# Patient Record
Sex: Female | Born: 1970 | Race: Black or African American | Hispanic: No | Marital: Married | Smoking: Current every day smoker
Health system: Southern US, Community
[De-identification: ages and names within clinical notes are randomized; demographics above are authoritative.]

## PROBLEM LIST (undated history)

## (undated) HISTORY — PX: TUBAL LIGATION: SHX77

---

## 2002-03-05 ENCOUNTER — Other Ambulatory Visit: Admission: RE | Admit: 2002-03-05 | Discharge: 2002-03-05 | Payer: Self-pay | Admitting: Obstetrics and Gynecology

## 2003-03-31 ENCOUNTER — Other Ambulatory Visit: Admission: RE | Admit: 2003-03-31 | Discharge: 2003-03-31 | Payer: Self-pay | Admitting: Obstetrics and Gynecology

## 2003-04-01 ENCOUNTER — Other Ambulatory Visit: Admission: RE | Admit: 2003-04-01 | Discharge: 2003-04-01 | Payer: Self-pay | Admitting: Internal Medicine

## 2004-06-15 ENCOUNTER — Other Ambulatory Visit: Admission: RE | Admit: 2004-06-15 | Discharge: 2004-06-15 | Payer: Self-pay | Admitting: Obstetrics and Gynecology

## 2005-08-18 ENCOUNTER — Other Ambulatory Visit: Admission: RE | Admit: 2005-08-18 | Discharge: 2005-08-18 | Payer: Self-pay | Admitting: Obstetrics and Gynecology

## 2006-01-10 ENCOUNTER — Ambulatory Visit (HOSPITAL_COMMUNITY): Admission: RE | Admit: 2006-01-10 | Discharge: 2006-01-10 | Payer: Self-pay | Admitting: Obstetrics and Gynecology

## 2006-03-03 ENCOUNTER — Inpatient Hospital Stay (HOSPITAL_COMMUNITY): Admission: RE | Admit: 2006-03-03 | Discharge: 2006-03-05 | Payer: Self-pay | Admitting: Obstetrics and Gynecology

## 2006-03-06 ENCOUNTER — Encounter: Admission: RE | Admit: 2006-03-06 | Discharge: 2006-04-05 | Payer: Self-pay | Admitting: Obstetrics and Gynecology

## 2006-04-06 ENCOUNTER — Encounter: Admission: RE | Admit: 2006-04-06 | Discharge: 2006-04-14 | Payer: Self-pay | Admitting: Obstetrics and Gynecology

## 2011-11-21 ENCOUNTER — Other Ambulatory Visit: Payer: Self-pay | Admitting: Family Medicine

## 2011-11-21 ENCOUNTER — Other Ambulatory Visit: Payer: Self-pay

## 2011-11-21 DIAGNOSIS — R319 Hematuria, unspecified: Secondary | ICD-10-CM

## 2011-11-22 ENCOUNTER — Other Ambulatory Visit: Payer: Self-pay

## 2012-06-14 ENCOUNTER — Other Ambulatory Visit: Payer: Self-pay | Admitting: Family Medicine

## 2012-06-14 DIAGNOSIS — Z1231 Encounter for screening mammogram for malignant neoplasm of breast: Secondary | ICD-10-CM

## 2012-07-30 ENCOUNTER — Ambulatory Visit
Admission: RE | Admit: 2012-07-30 | Discharge: 2012-07-30 | Disposition: A | Discharge status: Home / Self Care | Payer: Managed Care, Other (non HMO) | Source: Ambulatory Visit | Attending: Family Medicine | Admitting: Family Medicine

## 2012-07-30 DIAGNOSIS — Z1231 Encounter for screening mammogram for malignant neoplasm of breast: Secondary | ICD-10-CM

## 2012-07-31 ENCOUNTER — Other Ambulatory Visit: Payer: Self-pay | Admitting: Family Medicine

## 2012-07-31 DIAGNOSIS — R928 Other abnormal and inconclusive findings on diagnostic imaging of breast: Secondary | ICD-10-CM

## 2012-08-08 ENCOUNTER — Ambulatory Visit
Admission: RE | Admit: 2012-08-08 | Discharge: 2012-08-08 | Disposition: A | Discharge status: Home / Self Care | Payer: Managed Care, Other (non HMO) | Source: Ambulatory Visit | Attending: Family Medicine | Admitting: Family Medicine

## 2012-08-08 DIAGNOSIS — R928 Other abnormal and inconclusive findings on diagnostic imaging of breast: Secondary | ICD-10-CM

## 2014-07-28 ENCOUNTER — Other Ambulatory Visit: Payer: Self-pay | Admitting: Family Medicine

## 2014-07-28 DIAGNOSIS — R921 Mammographic calcification found on diagnostic imaging of breast: Secondary | ICD-10-CM

## 2015-01-26 ENCOUNTER — Encounter (HOSPITAL_COMMUNITY): Payer: Self-pay | Admitting: Family Medicine

## 2015-01-26 ENCOUNTER — Emergency Department (HOSPITAL_COMMUNITY): Payer: Managed Care, Other (non HMO)

## 2015-01-26 ENCOUNTER — Emergency Department (HOSPITAL_COMMUNITY)
Admission: EM | Admit: 2015-01-26 | Discharge: 2015-01-26 | Disposition: A | Discharge status: Home / Self Care | Payer: Managed Care, Other (non HMO) | Attending: Emergency Medicine | Admitting: Emergency Medicine

## 2015-01-26 DIAGNOSIS — R000 Tachycardia, unspecified: Secondary | ICD-10-CM | POA: Insufficient documentation

## 2015-01-26 DIAGNOSIS — Z79899 Other long term (current) drug therapy: Secondary | ICD-10-CM | POA: Diagnosis not present

## 2015-01-26 DIAGNOSIS — J029 Acute pharyngitis, unspecified: Secondary | ICD-10-CM | POA: Diagnosis not present

## 2015-01-26 DIAGNOSIS — R9431 Abnormal electrocardiogram [ECG] [EKG]: Secondary | ICD-10-CM | POA: Diagnosis present

## 2015-01-26 DIAGNOSIS — F17200 Nicotine dependence, unspecified, uncomplicated: Secondary | ICD-10-CM | POA: Insufficient documentation

## 2015-01-26 LAB — CBC
HEMATOCRIT: 44.1 % (ref 36.0–46.0)
HEMOGLOBIN: 15.1 g/dL — AB (ref 12.0–15.0)
MCH: 35 pg — AB (ref 26.0–34.0)
MCHC: 34.2 g/dL (ref 30.0–36.0)
MCV: 102.1 fL — ABNORMAL HIGH (ref 78.0–100.0)
Platelets: 316 10*3/uL (ref 150–400)
RBC: 4.32 MIL/uL (ref 3.87–5.11)
RDW: 12.6 % (ref 11.5–15.5)
WBC: 11.4 10*3/uL — ABNORMAL HIGH (ref 4.0–10.5)

## 2015-01-26 LAB — BASIC METABOLIC PANEL
ANION GAP: 14 (ref 5–15)
BUN: <5 mg/dL — ABNORMAL LOW (ref 6–20)
CO2: 23 mmol/L (ref 22–32)
Calcium: 10.1 mg/dL (ref 8.9–10.3)
Chloride: 99 mmol/L — ABNORMAL LOW (ref 101–111)
Creatinine, Ser: 0.83 mg/dL (ref 0.44–1.00)
GFR calc non Af Amer: >60 mL/min (ref >60)
GLUCOSE: 112 mg/dL — AB (ref 65–99)
POTASSIUM: 4.1 mmol/L (ref 3.5–5.1)
Sodium: 136 mmol/L (ref 135–145)

## 2015-01-26 LAB — T4, FREE: Free T4: 0.87 ng/dL (ref 0.61–1.12)

## 2015-01-26 LAB — I-STAT TROPONIN, ED: Troponin i, poc: 0 ng/mL (ref 0.00–0.08)

## 2015-01-26 LAB — TSH: TSH: 1.325 u[IU]/mL (ref 0.350–4.500)

## 2015-01-26 MED ORDER — SODIUM CHLORIDE 0.9 % IV BOLUS (SEPSIS)
1000.0000 mL | Freq: Once | INTRAVENOUS | Status: AC
  Administered 2015-01-26: 1000 mL via INTRAVENOUS

## 2015-01-26 NOTE — Discharge Instructions (Signed)
As discussed, your evaluation today has been largely reassuring.  But, it is important that you monitor your condition carefully, and do not hesitate to return to the ED if you develop new, or concerning changes in your condition. ? ?Otherwise, please follow-up with your physician for appropriate ongoing care. ? ?

## 2015-01-26 NOTE — ED Provider Notes (Signed)
CSN: [id number]   [id number]  Arrival date & time 01/26/15  1547 History   First MD Initiated Contact with Patient 01/26/15 1802     Chief Complaint  Patient presents with  . sent here for abnormal EKG      (Consider location/radiation/quality/duration/timing/severity/associated sxs/prior Treatment) HPI Reason presents from her physician's office due to staff concerns of tachycardia, hypertension. The patient went to the office earlier today due to sore throat. She notes that she has a sore throat for several days. She denies other ongoing illness, including no fever, chills, nausea, vomiting, lightheadedness, chest pain, dyspnea. With the persistency of the sore throat, she went for evaluation. Per her report, she was told that she had notably elevated blood pressure, heart rate, and was sent here for evaluation. She denies recent diet changes, medication changes, activity changes. She denies history of hypertension, thyroid disease, other substantial medical problems.  History reviewed. No pertinent past medical history. History reviewed. No pertinent past surgical history. History reviewed. No pertinent family history. Social History  Substance Use Topics  . Smoking status: Current Every Day Smoker  . Smokeless tobacco: None  . Alcohol Use: Yes   OB History    No data available     Review of Systems  Constitutional:       Per HPI, otherwise negative  HENT:       Per HPI, otherwise negative  Respiratory:       Per HPI, otherwise negative  Cardiovascular:       Per HPI, otherwise negative  Gastrointestinal: Negative for vomiting.  Endocrine:       Negative aside from HPI  Genitourinary:       Neg aside from HPI   Musculoskeletal:       Per HPI, otherwise negative  Skin: Negative.   Neurological: Negative for syncope.      Allergies  Review of patient's allergies indicates no known allergies.  Home Medications   Prior to Admission medications   Medication Sig Start  Date End Date Taking? Authorizing Provider  Multiple Vitamin (MULTIVITAMIN WITH MINERALS) TABS tablet Take 1 tablet by mouth daily.   Yes Historical Provider, MD  OVER THE COUNTER MEDICATION Take 1 application by mouth daily as needed (for weight gain).    Yes Historical Provider, MD   BP 148/111 mmHg  Pulse 129  Temp(Src) 98 F (36.7 C) (Oral)  Resp 16  Ht 5\' 2"  (1.575 m)  Wt 90 lb (40.824 kg)  BMI 16.46 kg/m2  SpO2 100% Physical Exam  Constitutional: She is oriented to person, place, and time. She appears well-developed and well-nourished. No distress.  HENT:  Head: Normocephalic and atraumatic.  Mouth/Throat: Uvula is midline, oropharynx is clear and moist and mucous membranes are normal.  Eyes: Conjunctivae and EOM are normal.  Neck: Neck supple. No tracheal tenderness and no muscular tenderness present. No rigidity. No edema, no erythema and normal range of motion present.  Cardiovascular: Regular rhythm.  Tachycardia present.   Pulmonary/Chest: Effort normal and breath sounds normal. No stridor. No respiratory distress.  Abdominal: She exhibits no distension.  Musculoskeletal: She exhibits no edema.  Lymphadenopathy:    She has no cervical adenopathy.  Neurological: She is alert and oriented to person, place, and time. No cranial nerve deficit.  Skin: Skin is warm and dry.  Psychiatric: She has a normal mood and affect.  Nursing note and vitals reviewed.   ED Course  Procedures (including critical care time) Labs Review Labs Reviewed  BASIC  METABOLIC PANEL - Abnormal; Notable for the following:    Chloride 99 (*)    Glucose, Bld 112 (*)    BUN <5 (*)    All other components within normal limits  CBC - Abnormal; Notable for the following:    WBC 11.4 (*)    Hemoglobin 15.1 (*)    MCV 102.1 (*)    MCH 35.0 (*)    All other components within normal limits  TSH  T4, FREE  I-STAT TROPOININ, ED    Imaging Review Dg Chest 2 View  01/26/2015  CLINICAL DATA:   Patient referred to ER From urgent care for fast heart rate and high blood pressure heart rate was 128 bp 140/102 3 hours ago. bp recently checked and has not decreased.smokes 1 pack a day, EXAM: CHEST  2 VIEW COMPARISON:  None. FINDINGS: Mild convex left thoracolumbar spine curvature. Midline trachea. Normal heart size and mediastinal contours. No pleural effusion or pneumothorax. Clear lungs. No congestive failure. IMPRESSION: No acute cardiopulmonary disease. Electronically Signed   By: Jeronimo Greaves M.D.   On: 01/26/2015 17:25   I have personally reviewed and evaluated these images and lab results as part of my medical decision-making.   EKG Interpretation   Date/Time:  Monday January 26 2015 16:07:32 EST Ventricular Rate:  132 PR Interval:  136 QRS Duration: 74 QT Interval:  378 QTC Calculation: 560 R Axis:   177 Text Interpretation: Long QTc Sinus tachycardia  Left posterior fascicular block T wave abnormality, consider inferior  ischemia Abnormal ECG Sinus tachycardia Artifact T wave abnormality QT  prolonged Abnormal ekg Confirmed by Gerhard Munch  MD [phone]) on  01/26/2015 7:01:10 PM     Cardiac 111 sinus tach abnormal  I reviewed the EKG from the patient's physicians office, similar to our EKG performed here.   ecg #2 following ivf   EKG Interpretation  Date/Time:  Monday January 26 2015 19:59:01 EST Ventricular Rate:  112 PR Interval:  170 QRS Duration: 91 QT Interval:  326 QTC Calculation: 445 R Axis:   56 Text Interpretation:  Sinus tachycardia ST elevation, consider inferior injury Sinus tachycardia Artifact QT is shorter than prior Abnormal ekg Confirmed by Gerhard Munch  MD [phone]) on 01/26/2015 8:03:07 PM      On repeat exam the patient remains in no distress, no new complaints. Heart rate has diminished, and she continues to have lites other than sore throat.  MDM  She presents from her physician's office with concern for tachycardia,  hypertension. Here the patient is awake, alert, in no distress. Patient does have mild persistent tachycardia, though improves with IV fluids. Patient's blood pressure is notable for elevated diastolic value, but patient has no asymmetry of pulses, no widening of the mediastinum, no evidence for neurovascular compromise. Some suspicion for ongoing viral pharyngitis. With other considerations for persistent tachycardia, mild hypertension, the patient had endocrine labs sent. With an otherwise a symptomatically patient, she was discharged in stable condition to follow-up as an outpatient, tomorrow, with primary care and cardiology.  Gerhard Munch, MD 01/26/15 2005

## 2015-01-26 NOTE — ED Notes (Signed)
[facility]. sts she feels fine. Pt HR is 130, BP elevated. Denise any chest pain and SOB.

## 2015-01-29 ENCOUNTER — Encounter: Payer: Self-pay | Admitting: Cardiology

## 2015-01-29 ENCOUNTER — Ambulatory Visit (INDEPENDENT_AMBULATORY_CARE_PROVIDER_SITE_OTHER): Payer: Managed Care, Other (non HMO) | Admitting: Cardiology

## 2015-01-29 VITALS — BP 120/80 | HR 112 | Ht 62.0 in | Wt 94.0 lb

## 2015-01-29 DIAGNOSIS — R9431 Abnormal electrocardiogram [ECG] [EKG]: Secondary | ICD-10-CM | POA: Diagnosis not present

## 2015-01-29 DIAGNOSIS — R000 Tachycardia, unspecified: Secondary | ICD-10-CM

## 2015-01-29 DIAGNOSIS — I10 Essential (primary) hypertension: Secondary | ICD-10-CM | POA: Diagnosis not present

## 2015-01-29 NOTE — Progress Notes (Signed)
Cardiology Office Note  NEW PATIENT NOTE   Date:  01/29/2015   ID:  Victoria Hester, DOB 10/08/1970, MRN [id number]  PCP:  Lilia ArgueKAPLAN,KRISTEN, PA-C  Cardiologist:  New Dr. Clifton JamesMcAlhany     Chief Complaint  Patient presents with  . Hypertension    no pain      History of Present Illness: Victoria Guildamla Jolly is a 44 y.o. female who presents for post hospitalization-ER visit for tachycardia and hypertension. BP in the ER was 148/111 and pulse 129 she also dx with oral pharyngitis and her HR improved with IV fluids.   Labs were normal with neg troponin and CXR with normal heart size, no acute cardiopulmonary disease.   Today no further throat pain, no chest pain and no SOB.  She does smoke about half a ppd.  She has lost 5-6 lbs over last several months.  She does complain of fatigue.  Her HR today is 112.      History reviewed. No pertinent past medical history.  Denies CVA,, HTN, DM, heart disease   Past Surgical History  Procedure Laterality Date  . Tubal ligation       Current Outpatient Prescriptions  Medication Sig Dispense Refill  . Multiple Vitamin (MULTIVITAMIN WITH MINERALS) TABS tablet Take 1 tablet by mouth daily.     No current facility-administered medications for this visit.    Allergies:   Review of patient's allergies indicates no known allergies.    Social History:  The patient  reports that she has been smoking.  She has never used smokeless tobacco. She reports that she drinks about 1.2 oz of alcohol per week. She reports that she does not use illicit drugs.   Family History:  The patient's family history includes Diabetes type II in her mother; Healthy in her brother; Hypertension in her mother. There is no history of Heart attack or Stroke.    ROS:  General:no colds or fevers, + 5 lb weight loss over several months she does eat but decreased appetite. + fatigue Skin:no rashes or ulcers HEENT:no blurred vision, no congestion CV:see HPI PUL:see HPI GI:no diarrhea  constipation or melena, no indigestion GU:no hematuria, no dysuria MS:no joint pain, no claudication Neuro:no syncope, no lightheadedness Endo:no diabetes, no thyroid disease recent thyroid was stable.   Wt Readings from Last 3 Encounters:  01/29/15 94 lb (42.638 kg)  01/26/15 90 lb (40.824 kg)     PHYSICAL EXAM: VS:  BP 120/80 mmHg  Pulse 112  Ht 5\' 2"  (1.575 m)  Wt 94 lb (42.638 kg)  BMI 17.19 kg/m2  LMP 01/15/2015 , BMI Body mass index is 17.19 kg/(m^2). General:Pleasant affect, NAD Skin:Warm and dry, brisk capillary refill HEENT:normocephalic, sclera clear, mucus membranes moist Neck:supple, no JVD, no bruits  Heart:S1S2 RRR without murmur, + S3 gallup, no rub or click Lungs:clear without rales, rhonchi, or wheezes NWG:NFAOAbd:soft, non tender, + BS, do not palpate liver spleen or masses Ext:no lower ext edema, 2+ pedal pulses, 2+ radial pulses Neuro:alert and oriented X 3, MAE, follows commands, + facial symmetry    EKG:  EKG is ordered today. The ekg ordered today demonstrates STach 112 t wave inversion in V 3 new small Q wave in II,III, AVF,  Overall improved from ER visit.     Recent Labs: 01/26/2015: BUN <5*; Creatinine, Ser 0.83; Hemoglobin 15.1*; Platelets 316; Potassium 4.1; Sodium 136; TSH 1.325    Lipid Panel No results found for: CHOL, TRIG, HDL, CHOLHDL, VLDL, LDLCALC, LDLDIRECT  Other studies Reviewed: Additional studies/ records that were reviewed today include: hospital notes.    ASSESSMENT AND PLAN:  1.  HTN one episode in ER today well controlled.  Follow up with PCP in 1 month to eval BP.  2. Sinus tach, continues to be elevated at 112 but improved from 130 in ER.  IV fluids improved HR and BP in ER, with wt loss possible dehydration.  Encouraged to eat healthy and drink boost or ensure if missing meals.  Normal thyroid levels in ER  3. Abnormal EKG  Will check TTE to eval LV function.  We will call if abnormal but to follow up otherwise with Dr.  Clifton James in 3-4 months.    4. Tobacco use  Asked to decrease.    Gave work note to be out until 02/02/15.    Current medicines are reviewed with the patient today.  The patient Has no concerns regarding medicines.  The following changes have been made:  See above Labs/ tests ordered today include:see above  Disposition:   FU:  see above  Nyoka Lint, NP  01/29/2015 12:15 PM    [facility] [address] [place], [place], [place]  [address]/ [address] [place], [place] Phone: [phone]; Fax: [phone]  [phone]  I have personally seen this patient with Nada Boozer, NP.  I agree with the assessment and plan as outlined above. Will arrange echo to assess LvEF, exclude pericardial effusion.   MCALHANY,CHRISTOPHER 01/29/2015 1:07 PM

## 2015-01-29 NOTE — Patient Instructions (Addendum)
Medication Instructions:  Your physician recommends that you continue on your current medications as directed. Please refer to the Current Medication list given to you today.   Labwork: None ordered  Testing/Procedures: Your physician has requested that you have an echocardiogram. Echocardiography is a painless test that uses sound waves to create images of your heart. It provides your doctor with information about the size and shape of your heart and how well your heart's chambers and valves are working. This procedure takes approximately one hour. There are no restrictions for this procedure.    Follow-Up: Your physician recommends that you schedule a follow-up appointment in: 3-4 months with Dr.Mcalhany  Any Other Special Instructions Will Be Listed Below (If Applicable). Your physician discussed the hazards of tobacco use. Tobacco use cessation is recommended and techniques and options to help you quit were discussed.        If you need a refill on your cardiac medications before your next appointment, please call your pharmacy.

## 2015-02-12 ENCOUNTER — Ambulatory Visit (HOSPITAL_COMMUNITY): Payer: Managed Care, Other (non HMO) | Attending: Cardiovascular Disease

## 2015-02-12 ENCOUNTER — Other Ambulatory Visit: Payer: Self-pay

## 2015-02-12 DIAGNOSIS — R9431 Abnormal electrocardiogram [ECG] [EKG]: Secondary | ICD-10-CM | POA: Diagnosis not present

## 2015-02-12 DIAGNOSIS — F17200 Nicotine dependence, unspecified, uncomplicated: Secondary | ICD-10-CM | POA: Insufficient documentation

## 2015-02-12 DIAGNOSIS — R000 Tachycardia, unspecified: Secondary | ICD-10-CM | POA: Insufficient documentation

## 2015-02-13 ENCOUNTER — Telehealth: Payer: Self-pay | Admitting: *Deleted

## 2015-02-13 NOTE — Telephone Encounter (Signed)
-----   Message from Leone BrandLaura R Ingold, NP sent at 02/13/2015 11:00 AM EST ----- Echo was normal, keep follow up with Dr. Clifton JamesMcAlhany in March, call if problems in the mean time.

## 2015-02-13 NOTE — Telephone Encounter (Signed)
Called pt to make her aware that her ECHO was normal and just to keep her f/u appt with Dr. Clifton JamesMcAlhany in March.  Pt verbalized understanding.

## 2015-02-24 ENCOUNTER — Telehealth: Payer: Self-pay | Admitting: Cardiology

## 2015-02-24 NOTE — Telephone Encounter (Signed)
Left patient voicemail to call me back FMLA signed and ready for pick up.

## 2015-05-08 ENCOUNTER — Telehealth: Payer: Self-pay | Admitting: Cardiovascular Disease

## 2015-05-08 ENCOUNTER — Encounter: Payer: Managed Care, Other (non HMO) | Admitting: Cardiovascular Disease

## 2015-05-08 NOTE — Telephone Encounter (Signed)
No show for appt with me today. [name]

## 2015-05-08 NOTE — Progress Notes (Signed)
No show

## 2015-06-01 ENCOUNTER — Ambulatory Visit: Payer: Managed Care, Other (non HMO) | Admitting: Cardiology

## 2015-06-03 ENCOUNTER — Encounter: Payer: Self-pay | Admitting: Cardiology

## 2016-04-26 ENCOUNTER — Other Ambulatory Visit: Payer: Self-pay | Admitting: Family Medicine

## 2016-04-26 DIAGNOSIS — R921 Mammographic calcification found on diagnostic imaging of breast: Secondary | ICD-10-CM

## 2016-12-05 IMAGING — DX DG CHEST 2V
2 series · 2 of 2 positions shown · non-contrast
Comparison: None.

CLINICAL DATA: Patient referred to ER From [HOSPITAL] for fast
heart rate and high blood pressure heart rate was 128 bp 140/102 3
hours ago. bp recently checked and has not decreased.smokes 1 pack a
day,

EXAM:
CHEST  2 VIEW

[chest pa]
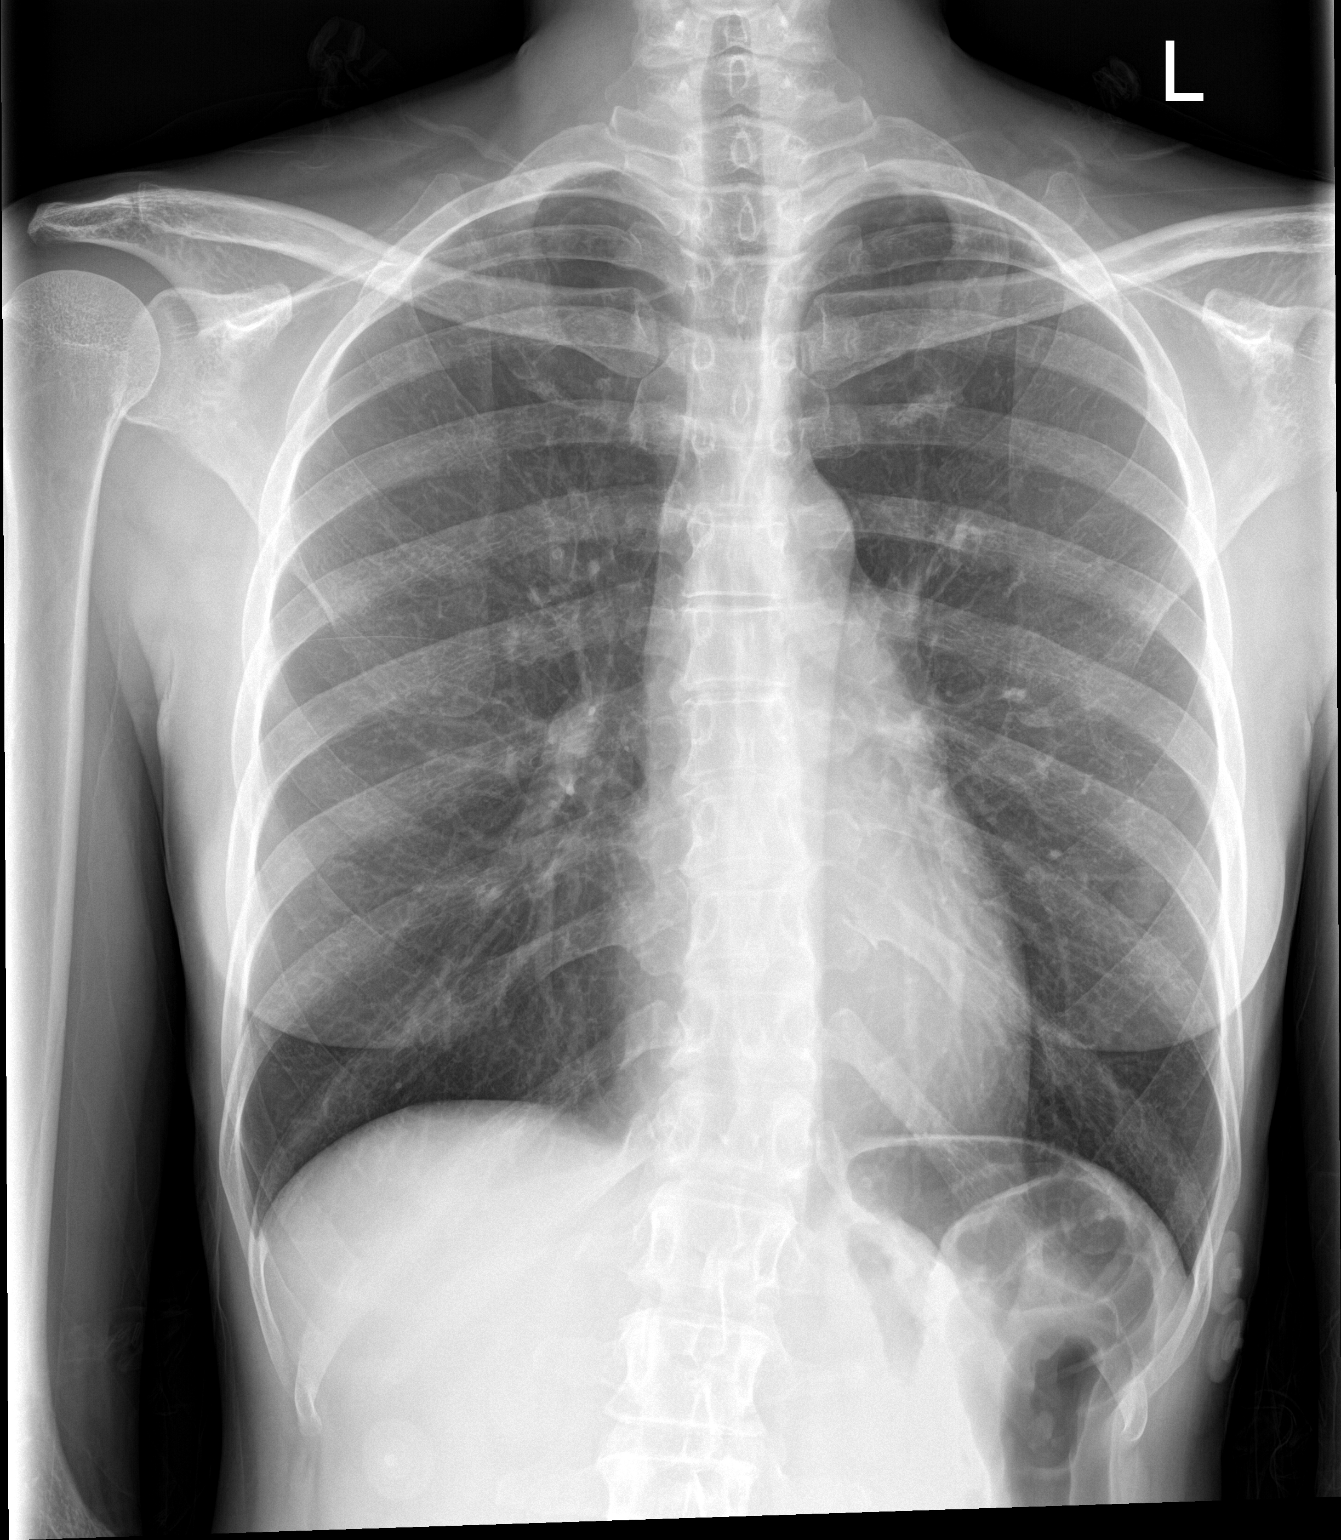

[chest lat]
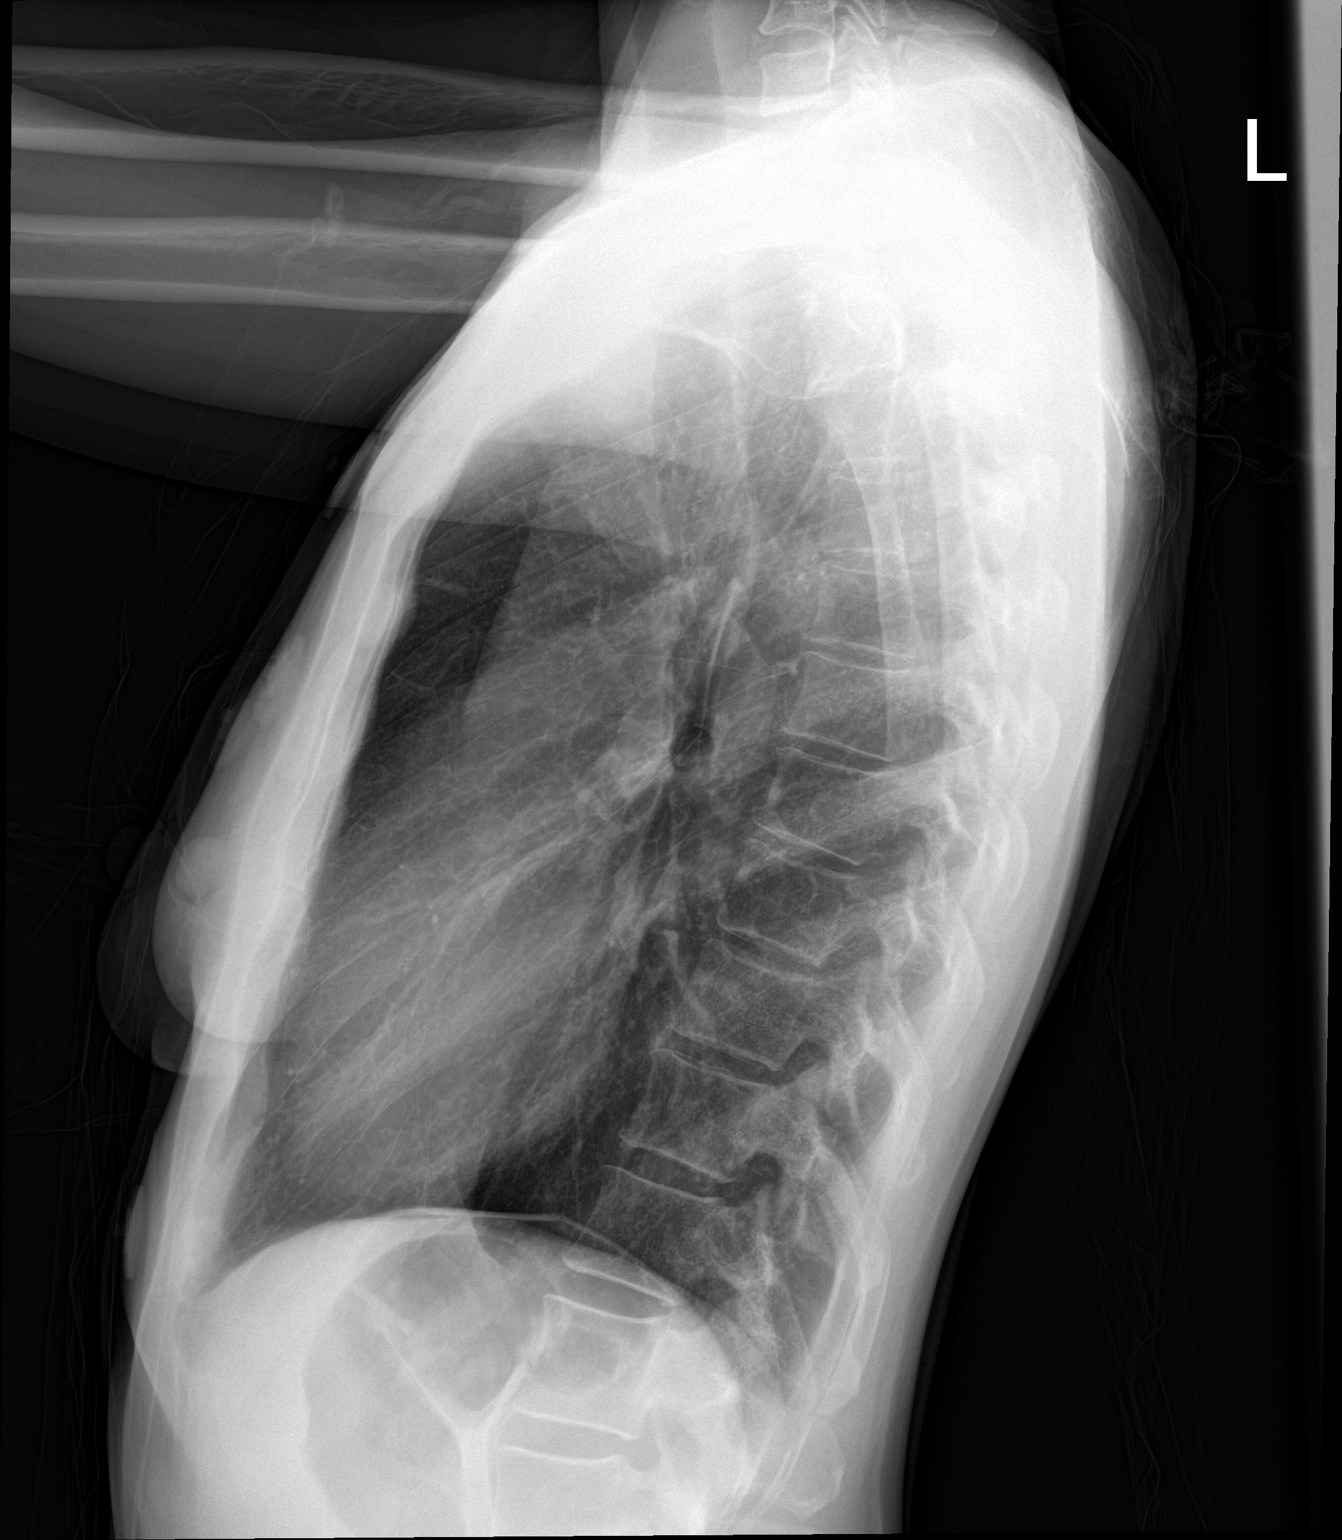

[2 of 2 positions shown; findings below may reference images not displayed]

FINDINGS: Mild convex left thoracolumbar spine curvature. Midline trachea.
Normal heart size and mediastinal contours. No pleural effusion or
pneumothorax. Clear lungs. No congestive failure.
IMPRESSION: No acute cardiopulmonary disease.

## 2017-10-03 ENCOUNTER — Other Ambulatory Visit: Payer: Self-pay | Admitting: Family Medicine

## 2017-10-03 DIAGNOSIS — R921 Mammographic calcification found on diagnostic imaging of breast: Secondary | ICD-10-CM

## 2017-10-25 ENCOUNTER — Inpatient Hospital Stay
Admission: RE | Admit: 2017-10-25 | Discharge: 2017-10-25 | Disposition: A | Discharge status: Home / Self Care | Payer: Managed Care, Other (non HMO) | Source: Ambulatory Visit | Attending: Family Medicine | Admitting: Family Medicine

## 2017-12-05 ENCOUNTER — Other Ambulatory Visit: Payer: Self-pay | Admitting: Family Medicine

## 2017-12-05 DIAGNOSIS — R928 Other abnormal and inconclusive findings on diagnostic imaging of breast: Secondary | ICD-10-CM

## 2017-12-05 DIAGNOSIS — R921 Mammographic calcification found on diagnostic imaging of breast: Secondary | ICD-10-CM

## 2020-10-12 ENCOUNTER — Other Ambulatory Visit: Payer: Self-pay | Admitting: Family Medicine

## 2020-10-12 DIAGNOSIS — Z1231 Encounter for screening mammogram for malignant neoplasm of breast: Secondary | ICD-10-CM

## 2020-11-20 ENCOUNTER — Ambulatory Visit: Payer: Managed Care, Other (non HMO)

## 2022-09-29 ENCOUNTER — Other Ambulatory Visit: Payer: Self-pay | Admitting: Family Medicine

## 2022-09-29 ENCOUNTER — Encounter: Payer: Self-pay | Admitting: Family Medicine

## 2022-09-29 DIAGNOSIS — Z0000 Encounter for general adult medical examination without abnormal findings: Secondary | ICD-10-CM

## 2022-09-29 DIAGNOSIS — Z1231 Encounter for screening mammogram for malignant neoplasm of breast: Secondary | ICD-10-CM

## 2022-10-21 ENCOUNTER — Ambulatory Visit
Admission: RE | Admit: 2022-10-21 | Discharge: 2022-10-21 | Disposition: A | Discharge status: Home / Self Care | Payer: 59 | Source: Ambulatory Visit | Attending: Family Medicine | Admitting: Family Medicine

## 2022-10-21 DIAGNOSIS — Z1231 Encounter for screening mammogram for malignant neoplasm of breast: Secondary | ICD-10-CM

## 2022-10-25 ENCOUNTER — Other Ambulatory Visit: Payer: Self-pay | Admitting: Family Medicine

## 2022-10-25 DIAGNOSIS — R928 Other abnormal and inconclusive findings on diagnostic imaging of breast: Secondary | ICD-10-CM

## 2022-10-28 ENCOUNTER — Other Ambulatory Visit: Payer: 59

## 2022-11-04 ENCOUNTER — Ambulatory Visit
Admission: RE | Admit: 2022-11-04 | Discharge: 2022-11-04 | Disposition: A | Discharge status: Home / Self Care | Payer: 59 | Source: Ambulatory Visit | Attending: Family Medicine | Admitting: Family Medicine

## 2022-11-04 DIAGNOSIS — R928 Other abnormal and inconclusive findings on diagnostic imaging of breast: Secondary | ICD-10-CM

## 2022-11-07 ENCOUNTER — Other Ambulatory Visit: Payer: Self-pay | Admitting: Family Medicine

## 2022-11-07 DIAGNOSIS — R928 Other abnormal and inconclusive findings on diagnostic imaging of breast: Secondary | ICD-10-CM

## 2022-11-18 ENCOUNTER — Ambulatory Visit
Admission: RE | Admit: 2022-11-18 | Discharge: 2022-11-18 | Disposition: A | Discharge status: Home / Self Care | Payer: 59 | Source: Ambulatory Visit | Attending: Family Medicine | Admitting: Family Medicine

## 2022-11-18 DIAGNOSIS — R928 Other abnormal and inconclusive findings on diagnostic imaging of breast: Secondary | ICD-10-CM

## 2022-11-18 HISTORY — PX: BREAST BIOPSY: SHX20

## 2022-11-21 LAB — SURGICAL PATHOLOGY
